# Patient Record
Sex: Female | Born: 1935 | Race: Black or African American | Hispanic: No
Health system: Southern US, Community
[De-identification: ages and names within clinical notes are randomized; demographics above are authoritative.]

---

## 2003-08-09 ENCOUNTER — Other Ambulatory Visit: Payer: Self-pay

## 2003-10-08 ENCOUNTER — Other Ambulatory Visit: Payer: Self-pay

## 2004-08-31 ENCOUNTER — Emergency Department: Payer: Self-pay | Admitting: Emergency Medicine

## 2005-01-03 ENCOUNTER — Other Ambulatory Visit: Payer: Self-pay

## 2005-01-03 ENCOUNTER — Emergency Department: Payer: Self-pay | Admitting: Emergency Medicine

## 2005-05-02 ENCOUNTER — Emergency Department: Payer: Self-pay | Admitting: Internal Medicine

## 2005-11-05 ENCOUNTER — Emergency Department: Payer: Self-pay | Admitting: Emergency Medicine

## 2006-01-09 ENCOUNTER — Emergency Department: Payer: Self-pay | Admitting: Emergency Medicine

## 2006-01-23 ENCOUNTER — Other Ambulatory Visit: Payer: Self-pay

## 2006-01-23 ENCOUNTER — Emergency Department: Payer: Self-pay | Admitting: Emergency Medicine

## 2006-03-19 ENCOUNTER — Other Ambulatory Visit: Payer: Self-pay

## 2006-03-19 ENCOUNTER — Inpatient Hospital Stay: Payer: Self-pay | Admitting: Internal Medicine

## 2006-07-18 ENCOUNTER — Emergency Department: Payer: Self-pay | Admitting: Emergency Medicine

## 2006-07-18 ENCOUNTER — Other Ambulatory Visit: Payer: Self-pay

## 2006-12-10 ENCOUNTER — Emergency Department: Payer: Self-pay | Admitting: Emergency Medicine

## 2006-12-10 ENCOUNTER — Other Ambulatory Visit: Payer: Self-pay

## 2007-03-25 ENCOUNTER — Emergency Department: Payer: Self-pay | Admitting: Internal Medicine

## 2007-07-06 ENCOUNTER — Emergency Department: Payer: Self-pay | Admitting: Emergency Medicine

## 2007-07-06 ENCOUNTER — Other Ambulatory Visit: Payer: Self-pay

## 2008-01-22 ENCOUNTER — Other Ambulatory Visit: Payer: Self-pay

## 2008-01-22 ENCOUNTER — Inpatient Hospital Stay: Payer: Self-pay | Admitting: Vascular Surgery

## 2008-04-20 ENCOUNTER — Emergency Department: Payer: Self-pay | Admitting: Emergency Medicine

## 2008-09-07 ENCOUNTER — Emergency Department: Payer: Self-pay | Admitting: Unknown Physician Specialty

## 2008-12-29 ENCOUNTER — Emergency Department: Payer: Self-pay | Admitting: Emergency Medicine

## 2009-02-12 ENCOUNTER — Emergency Department: Payer: Self-pay | Admitting: Unknown Physician Specialty

## 2009-03-29 ENCOUNTER — Emergency Department: Payer: Self-pay | Admitting: Emergency Medicine

## 2009-07-31 ENCOUNTER — Observation Stay: Payer: Self-pay | Admitting: Specialist

## 2010-01-17 ENCOUNTER — Emergency Department: Payer: Self-pay | Admitting: Emergency Medicine

## 2010-02-25 ENCOUNTER — Emergency Department: Payer: Self-pay | Admitting: Unknown Physician Specialty

## 2010-04-23 ENCOUNTER — Emergency Department: Payer: Self-pay | Admitting: Emergency Medicine

## 2011-10-20 ENCOUNTER — Emergency Department: Payer: Self-pay | Admitting: Emergency Medicine

## 2011-10-20 LAB — CBC
HGB: 11.3 g/dL — ABNORMAL LOW (ref 12.0–16.0)
MCH: 29.7 pg (ref 26.0–34.0)
MCHC: 32.8 g/dL (ref 32.0–36.0)
Platelet: 260 10*3/uL (ref 150–440)
RDW: 15.1 % — ABNORMAL HIGH (ref 11.5–14.5)

## 2011-10-20 LAB — COMPREHENSIVE METABOLIC PANEL
Albumin: 4 g/dL (ref 3.4–5.0)
Alkaline Phosphatase: 83 U/L (ref 50–136)
Anion Gap: 7 (ref 7–16)
Bilirubin,Total: 0.3 mg/dL (ref 0.2–1.0)
Chloride: 104 mmol/L (ref 98–107)
Creatinine: 0.91 mg/dL (ref 0.60–1.30)
EGFR (African American): >60
Glucose: 93 mg/dL (ref 65–99)
Potassium: 3.9 mmol/L (ref 3.5–5.1)
SGOT(AST): 20 U/L (ref 15–37)
SGPT (ALT): 17 U/L
Sodium: 140 mmol/L (ref 136–145)
Total Protein: 7.9 g/dL (ref 6.4–8.2)

## 2011-10-20 LAB — TROPONIN I: Troponin-I: <0.02 ng/mL

## 2011-10-24 ENCOUNTER — Observation Stay: Payer: Self-pay | Admitting: Internal Medicine

## 2011-10-24 LAB — COMPREHENSIVE METABOLIC PANEL
Albumin: 4.1 g/dL (ref 3.4–5.0)
Anion Gap: 7 (ref 7–16)
BUN: 16 mg/dL (ref 7–18)
Calcium, Total: 9.1 mg/dL (ref 8.5–10.1)
Chloride: 102 mmol/L (ref 98–107)
Co2: 29 mmol/L (ref 21–32)
Glucose: 98 mg/dL (ref 65–99)
Osmolality: 277 (ref 275–301)
SGOT(AST): 16 U/L (ref 15–37)
SGPT (ALT): 16 U/L
Sodium: 138 mmol/L (ref 136–145)
Total Protein: 8 g/dL (ref 6.4–8.2)

## 2011-10-24 LAB — URINALYSIS, COMPLETE
Bacteria: NONE SEEN
Ketone: NEGATIVE
WBC UR: 11 /HPF (ref 0–5)

## 2011-10-24 LAB — CK TOTAL AND CKMB (NOT AT ARMC)
CK, Total: 91 U/L (ref 21–215)
CK, Total: 82 U/L (ref 21–215)
CK, Total: 95 U/L (ref 21–215)
CK-MB: 1.2 ng/mL (ref 0.5–3.6)

## 2011-10-24 LAB — PROTIME-INR: INR: 0.9

## 2011-10-24 LAB — CBC WITH DIFFERENTIAL/PLATELET
Eosinophil %: 1.9 %
HCT: 37.3 % (ref 35.0–47.0)
HGB: 12.2 g/dL (ref 12.0–16.0)
Lymphocyte %: 27.2 %
MCHC: 32.7 g/dL (ref 32.0–36.0)
Monocyte %: 8.6 %
Platelet: 256 10*3/uL (ref 150–440)

## 2011-10-24 LAB — TROPONIN I
Troponin-I: <0.02 ng/mL
Troponin-I: <0.02 ng/mL
Troponin-I: <0.02 ng/mL

## 2011-10-25 LAB — CBC WITH DIFFERENTIAL/PLATELET
Basophil #: 0 10*3/uL (ref 0.0–0.1)
Eosinophil #: 0.2 10*3/uL (ref 0.0–0.7)
Eosinophil %: 2.4 %
HCT: 36.3 % (ref 35.0–47.0)
HGB: 11.7 g/dL — ABNORMAL LOW (ref 12.0–16.0)
Lymphocyte %: 31.6 %
MCH: 29.3 pg (ref 26.0–34.0)
MCHC: 32.3 g/dL (ref 32.0–36.0)
MCV: 91 fL (ref 80–100)
Monocyte #: 0.6 x10 3/mm (ref 0.2–0.9)
Neutrophil #: 3.4 10*3/uL (ref 1.4–6.5)
Platelet: 263 10*3/uL (ref 150–440)
RBC: 4 10*6/uL (ref 3.80–5.20)
RDW: 15.2 % — ABNORMAL HIGH (ref 11.5–14.5)
WBC: 6.1 10*3/uL (ref 3.6–11.0)

## 2011-10-25 LAB — BASIC METABOLIC PANEL
Anion Gap: 6 — ABNORMAL LOW (ref 7–16)
BUN: 18 mg/dL (ref 7–18)
Chloride: 103 mmol/L (ref 98–107)
Co2: 31 mmol/L (ref 21–32)
Creatinine: 1.03 mg/dL (ref 0.60–1.30)
EGFR (Non-African Amer.): 52 — ABNORMAL LOW
Glucose: 84 mg/dL (ref 65–99)
Potassium: 4 mmol/L (ref 3.5–5.1)

## 2011-10-25 LAB — LIPID PANEL
Cholesterol: 145 mg/dL (ref 0–200)
Triglycerides: 66 mg/dL (ref 0–200)
VLDL Cholesterol, Calc: 13 mg/dL (ref 5–40)

## 2012-08-17 ENCOUNTER — Emergency Department: Payer: Self-pay | Admitting: Emergency Medicine

## 2012-08-17 LAB — URINALYSIS, COMPLETE
Glucose,UR: NEGATIVE mg/dL (ref 0–75)
Ketone: NEGATIVE
Nitrite: NEGATIVE
Ph: 6 (ref 4.5–8.0)
RBC,UR: 17 /HPF (ref 0–5)
Specific Gravity: 1.02 (ref 1.003–1.030)
Squamous Epithelial: 1

## 2012-08-17 LAB — COMPREHENSIVE METABOLIC PANEL
BUN: 21 mg/dL — ABNORMAL HIGH (ref 7–18)
Bilirubin,Total: 0.3 mg/dL (ref 0.2–1.0)
Calcium, Total: 9.2 mg/dL (ref 8.5–10.1)
Co2: 27 mmol/L (ref 21–32)
Creatinine: 1.05 mg/dL (ref 0.60–1.30)
Osmolality: 280 (ref 275–301)
Potassium: 4.1 mmol/L (ref 3.5–5.1)
Sodium: 138 mmol/L (ref 136–145)
Total Protein: 8.1 g/dL (ref 6.4–8.2)

## 2012-08-17 LAB — CBC
HCT: 37.9 % (ref 35.0–47.0)
HGB: 12 g/dL (ref 12.0–16.0)
MCH: 28.7 pg (ref 26.0–34.0)
Platelet: 275 10*3/uL (ref 150–440)
RBC: 4.18 10*6/uL (ref 3.80–5.20)
RDW: 14.6 % — ABNORMAL HIGH (ref 11.5–14.5)

## 2012-08-18 LAB — LIPASE, BLOOD: Lipase: 169 U/L (ref 73–393)

## 2012-08-19 ENCOUNTER — Ambulatory Visit: Payer: Self-pay | Admitting: Unknown Physician Specialty

## 2012-08-19 LAB — COMPREHENSIVE METABOLIC PANEL
Alkaline Phosphatase: 89 U/L (ref 50–136)
Anion Gap: 7 (ref 7–16)
Bilirubin,Total: 0.5 mg/dL (ref 0.2–1.0)
Calcium, Total: 9.3 mg/dL (ref 8.5–10.1)
Chloride: 107 mmol/L (ref 98–107)
Creatinine: 0.97 mg/dL (ref 0.60–1.30)
EGFR (Non-African Amer.): 56 — ABNORMAL LOW
Glucose: 106 mg/dL — ABNORMAL HIGH (ref 65–99)
Osmolality: 287 (ref 275–301)
Potassium: 3.6 mmol/L (ref 3.5–5.1)
SGOT(AST): 23 U/L (ref 15–37)
SGPT (ALT): 18 U/L (ref 12–78)
Sodium: 141 mmol/L (ref 136–145)
Total Protein: 8.3 g/dL — ABNORMAL HIGH (ref 6.4–8.2)

## 2012-08-19 LAB — CBC
HCT: 36.4 % (ref 35.0–47.0)
HGB: 11.4 g/dL — ABNORMAL LOW (ref 12.0–16.0)
MCH: 28.4 pg (ref 26.0–34.0)
MCHC: 31.3 g/dL — ABNORMAL LOW (ref 32.0–36.0)
MCV: 91 fL (ref 80–100)
Platelet: 300 10*3/uL (ref 150–440)
RDW: 14.9 % — ABNORMAL HIGH (ref 11.5–14.5)

## 2012-08-19 LAB — URINALYSIS, COMPLETE
Bilirubin,UR: NEGATIVE
Leukocyte Esterase: NEGATIVE
Ph: 5 (ref 4.5–8.0)
RBC,UR: 10 /HPF (ref 0–5)
Squamous Epithelial: 6

## 2012-09-21 ENCOUNTER — Ambulatory Visit: Payer: Self-pay | Admitting: Unknown Physician Specialty

## 2012-10-27 ENCOUNTER — Emergency Department: Payer: Self-pay | Admitting: Emergency Medicine

## 2012-10-27 LAB — BASIC METABOLIC PANEL
BUN: 16 mg/dL (ref 7–18)
Calcium, Total: 9.2 mg/dL (ref 8.5–10.1)
EGFR (African American): >60
EGFR (Non-African Amer.): 52 — ABNORMAL LOW
Glucose: 74 mg/dL (ref 65–99)
Osmolality: 279 (ref 275–301)
Potassium: 3.8 mmol/L (ref 3.5–5.1)
Sodium: 140 mmol/L (ref 136–145)

## 2012-10-27 LAB — CBC WITH DIFFERENTIAL/PLATELET
Basophil #: 0 10*3/uL (ref 0.0–0.1)
Eosinophil #: 0.2 10*3/uL (ref 0.0–0.7)
HCT: 35.1 % (ref 35.0–47.0)
HGB: 11.5 g/dL — ABNORMAL LOW (ref 12.0–16.0)
Lymphocyte %: 23.6 %
MCH: 29.1 pg (ref 26.0–34.0)
MCHC: 32.7 g/dL (ref 32.0–36.0)
MCV: 89 fL (ref 80–100)
Monocyte #: 0.5 x10 3/mm (ref 0.2–0.9)
Neutrophil #: 3.6 10*3/uL (ref 1.4–6.5)
Platelet: 256 10*3/uL (ref 150–440)
RBC: 3.95 10*6/uL (ref 3.80–5.20)

## 2012-10-27 LAB — URINALYSIS, COMPLETE
Bacteria: NONE SEEN
Bilirubin,UR: NEGATIVE
Ketone: NEGATIVE
Leukocyte Esterase: NEGATIVE
Nitrite: NEGATIVE
Ph: 7 (ref 4.5–8.0)
Protein: NEGATIVE
RBC,UR: 1 /HPF (ref 0–5)
Squamous Epithelial: <1

## 2012-10-27 LAB — PROTIME-INR
INR: 1
Prothrombin Time: 13.6 secs (ref 11.5–14.7)

## 2012-10-27 LAB — HEPATIC FUNCTION PANEL A (ARMC)
Bilirubin, Direct: <0.1 mg/dL (ref 0.00–0.20)
Bilirubin,Total: 0.3 mg/dL (ref 0.2–1.0)
SGOT(AST): 22 U/L (ref 15–37)
SGPT (ALT): 17 U/L (ref 12–78)
Total Protein: 7.6 g/dL (ref 6.4–8.2)

## 2012-10-27 LAB — TROPONIN I: Troponin-I: <0.02 ng/mL

## 2012-10-27 LAB — APTT: Activated PTT: 29.2 secs (ref 23.6–35.9)

## 2013-02-14 ENCOUNTER — Emergency Department: Payer: Self-pay | Admitting: Emergency Medicine

## 2013-02-14 LAB — BASIC METABOLIC PANEL
Anion Gap: 5 — ABNORMAL LOW (ref 7–16)
Calcium, Total: 9.1 mg/dL (ref 8.5–10.1)
EGFR (African American): 56 — ABNORMAL LOW
EGFR (Non-African Amer.): 48 — ABNORMAL LOW
Glucose: 114 mg/dL — ABNORMAL HIGH (ref 65–99)
Osmolality: 281 (ref 275–301)
Sodium: 139 mmol/L (ref 136–145)

## 2013-02-14 LAB — URINALYSIS, COMPLETE
Glucose,UR: NEGATIVE mg/dL (ref 0–75)
Nitrite: NEGATIVE
Ph: 6 (ref 4.5–8.0)
Protein: NEGATIVE
RBC,UR: 9 /HPF (ref 0–5)
Squamous Epithelial: 3

## 2013-02-14 LAB — CBC
HGB: 12 g/dL (ref 12.0–16.0)
MCHC: 33.8 g/dL (ref 32.0–36.0)
MCV: 89 fL (ref 80–100)
Platelet: 243 10*3/uL (ref 150–440)
RBC: 3.99 10*6/uL (ref 3.80–5.20)
WBC: 6 10*3/uL (ref 3.6–11.0)

## 2013-02-14 LAB — MAGNESIUM: Magnesium: 2.2 mg/dL

## 2013-05-22 IMAGING — CT CT ABD-PELV W/ CM
1 of 3 series · 14 of 32 positions shown, 19 images · non-contrast
Comparison: none

REASON FOR EXAM: (1) GEN ABD PAIN; (2) N/V
COMMENTS:   May transport without cardiac monitor

PROCEDURE:     CT  - CT ABDOMEN / PELVIS  W  - August 18, 2012 [DATE]
RESULT:     History: Pain.
Comparison Study: No recent.

[Series 2: 3mm soft tissue · axial · 0.79mm/px · z∈[-397,-16]mm · 14 of 143 slices shown, 19 images]
[im 8/143  soft-tissue]
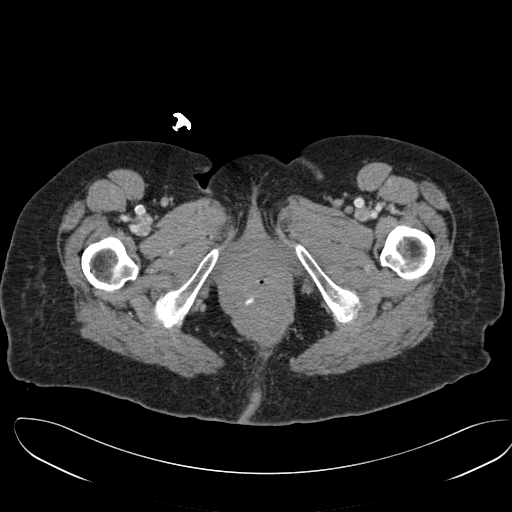
[im 8/143  bone]
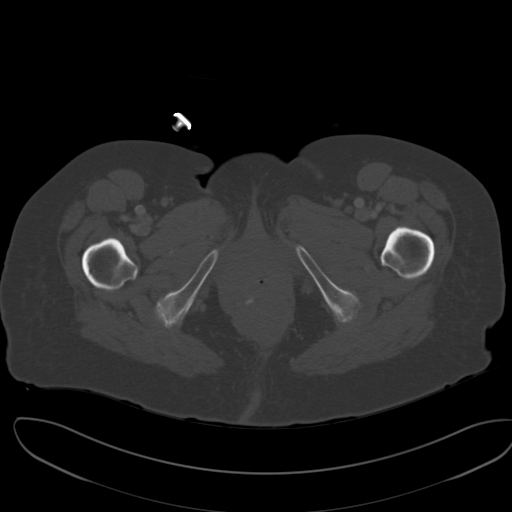
[im 23/143  soft-tissue]
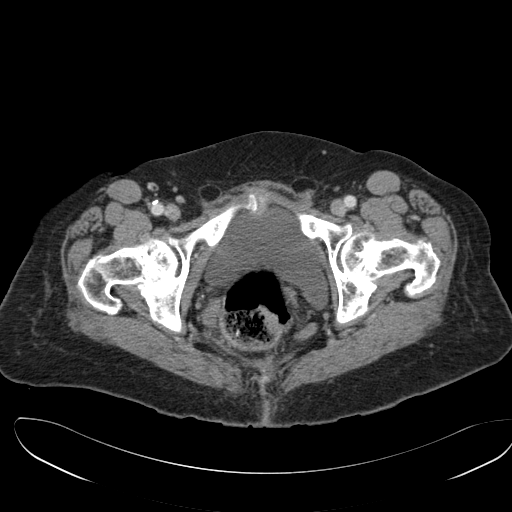
[im 30/143  soft-tissue]
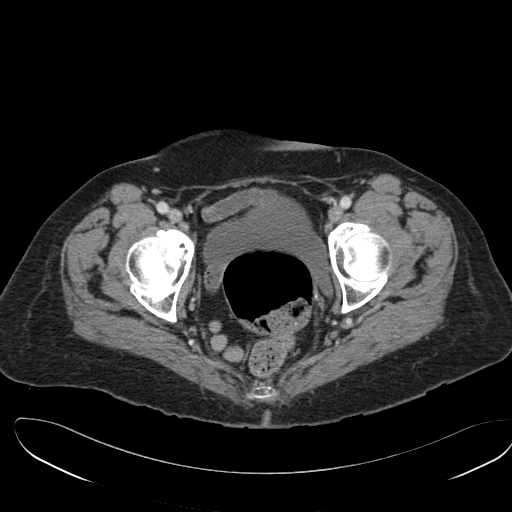
[im 38/143  soft-tissue]
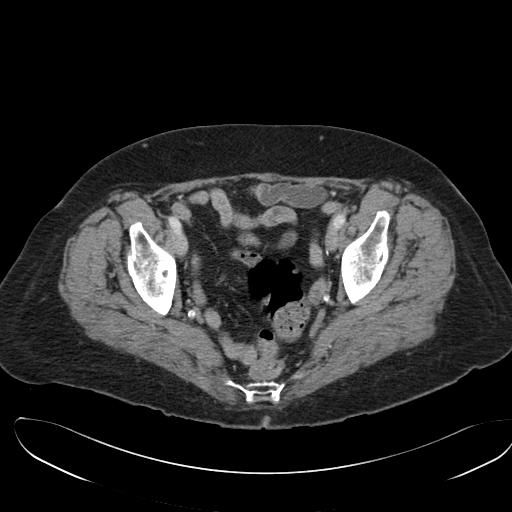
[im 53/143  soft-tissue]
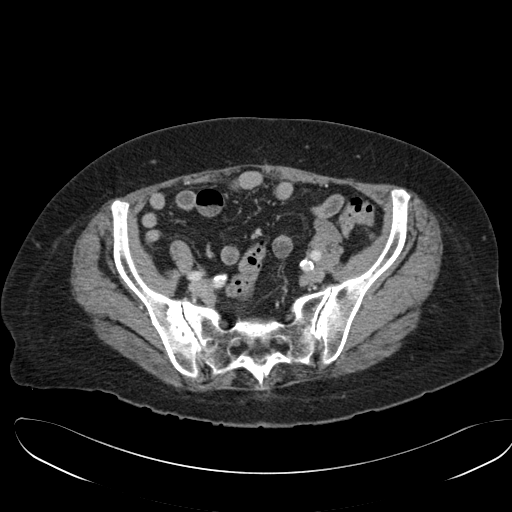
[im 60/143  soft-tissue]
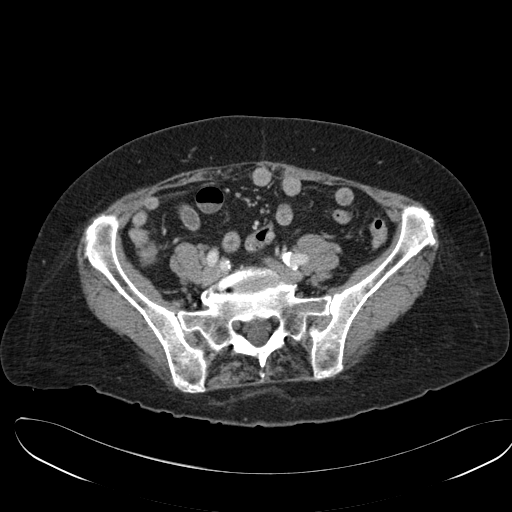
[im 75/143  soft-tissue]
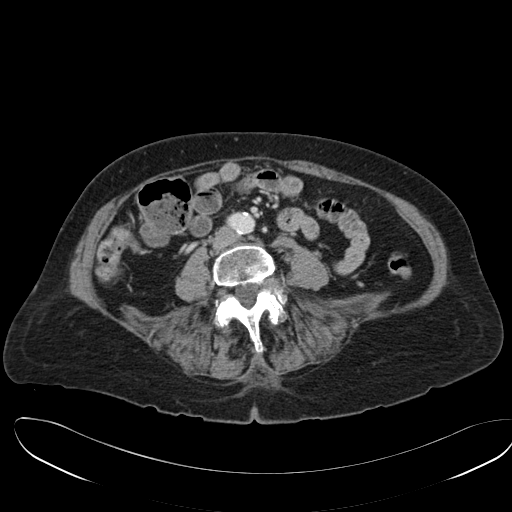
[im 83/143  soft-tissue]
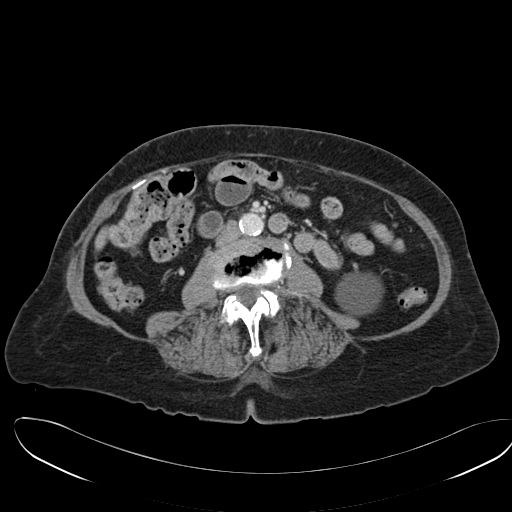
[im 90/143  soft-tissue]
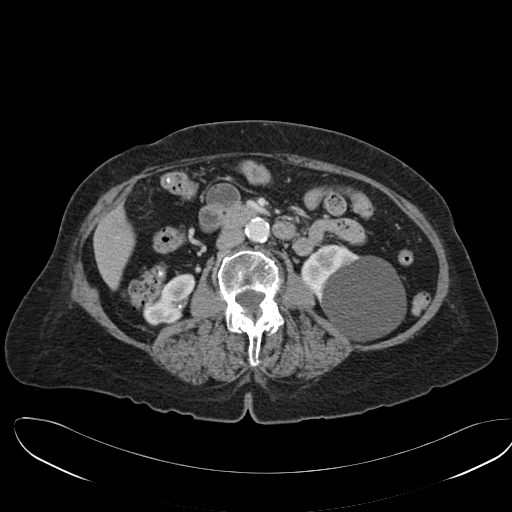
[im 90/143  bone]
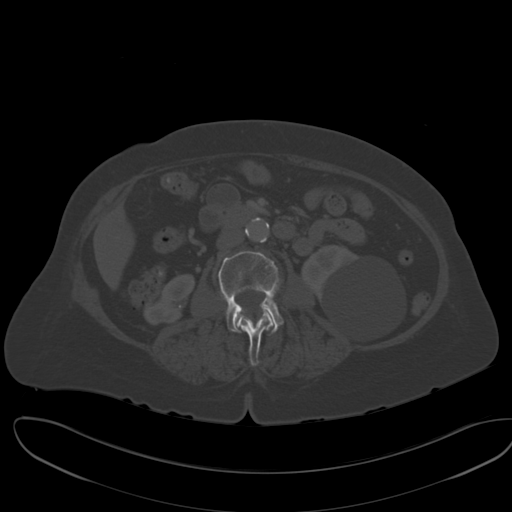
[im 105/143  soft-tissue]
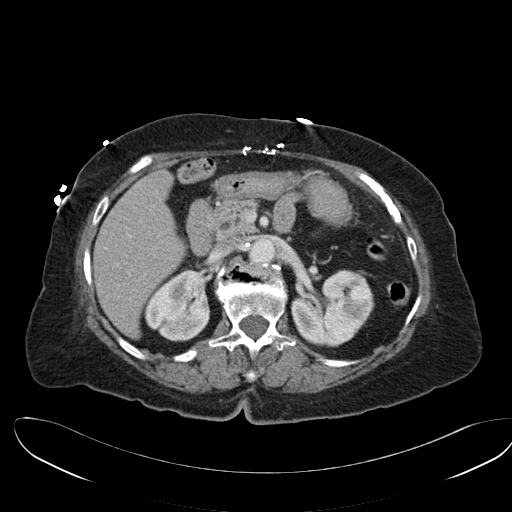
[im 113/143  soft-tissue]
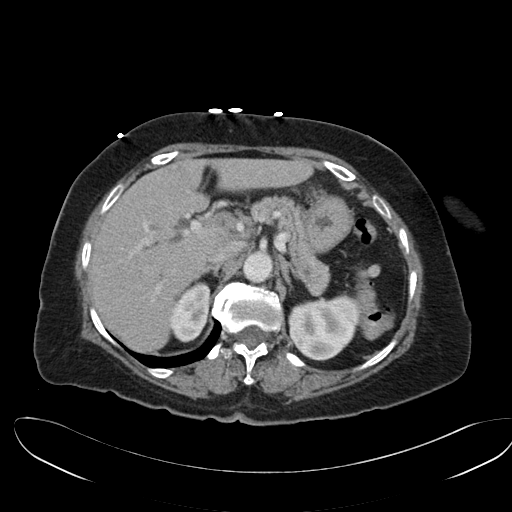
[im 113/143  lung]
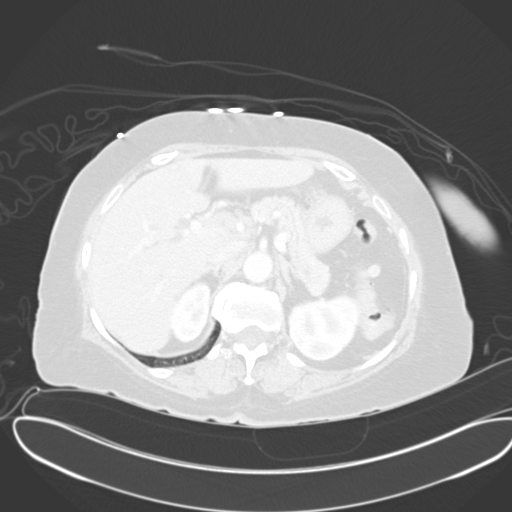
[im 120/143  soft-tissue]
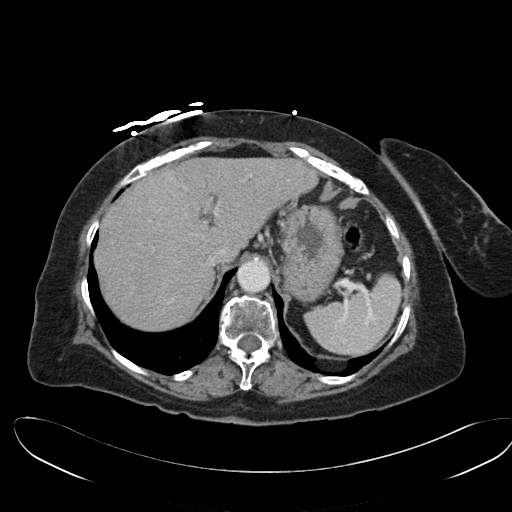
[im 120/143  lung]
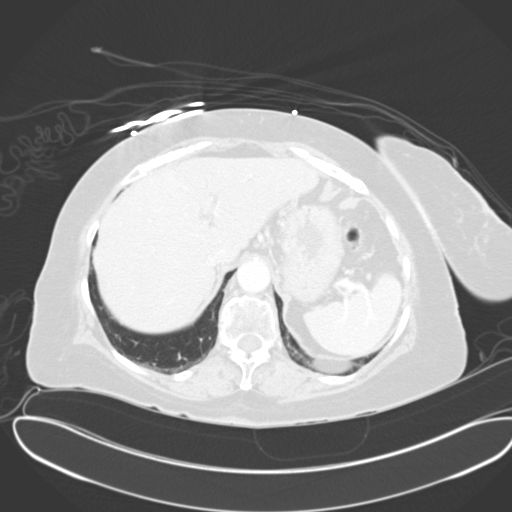
[im 128/143  lung]
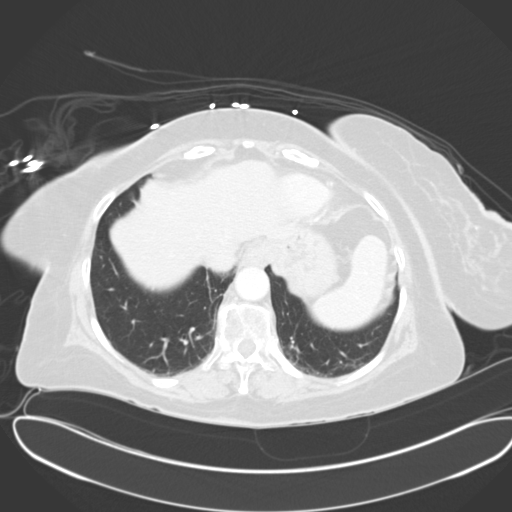
[im 135/143  soft-tissue]
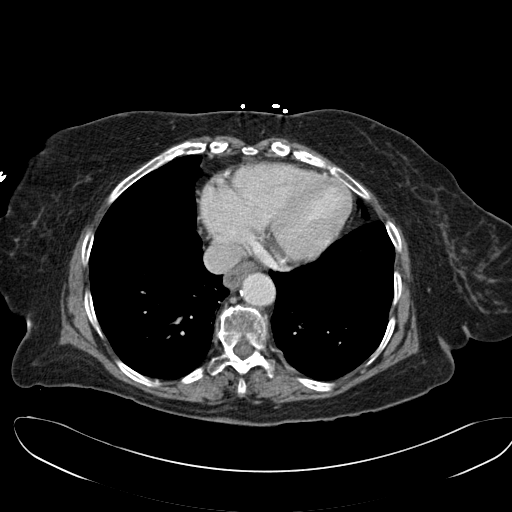
[im 135/143  lung]
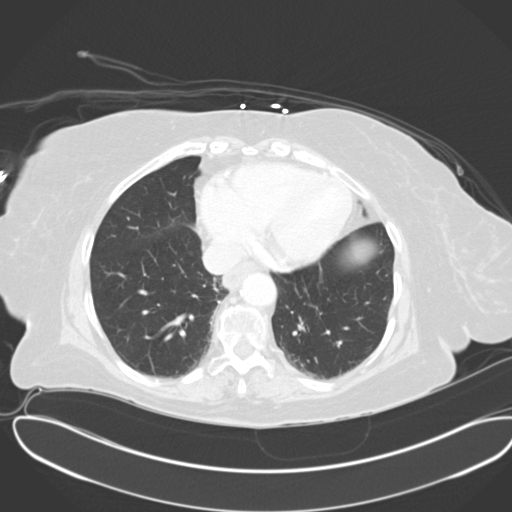

[14 of 32 positions shown; findings below may reference images not displayed]

FINDINGS: Standard CT obtained 85 cc of [id number]-S22. Liver normal. Mild
distention of the common bile duct to 7.5 mm with mild intrahepatic ductal
distention. M.R.C.P. can be obtained for further evaluation. Pancreas there
is trace no focal abnormalities is minimal prominence of the pancreatic
duct. Spleen normal. Bilateral renal cysts. Right nephrolithiasis. No
hydronephrosis. Aorta normal caliber. Appendix normal. No bowel distention.
Bladder is nondistended. Sclerosis of SI joints noted. Sliding hiatal hernia
noted. Debris is noted in the distal esophagus. To exclude an obstructing
lesion barium swallow and upper GI suggested.
IMPRESSION: 1. Debris noted in the distal esophagus/hiatal hernia. Barium swallow and
upper GI suggested to exclude obstructing lesion.
2. Mild biliary distention. M.R.C.P. suggested for further evaluation.
3. Bilateral renal cysts end tiny sand like right renal calyceal stone.

## 2013-07-16 ENCOUNTER — Emergency Department: Payer: Self-pay | Admitting: Emergency Medicine

## 2013-07-16 LAB — CBC WITH DIFFERENTIAL/PLATELET
BASOS ABS: 0.1 10*3/uL (ref 0.0–0.1)
Basophil %: 0.8 %
Eosinophil #: 0.1 10*3/uL (ref 0.0–0.7)
Eosinophil %: 1.8 %
HCT: 38 % (ref 35.0–47.0)
HGB: 12.2 g/dL (ref 12.0–16.0)
Lymphocyte #: 2 10*3/uL (ref 1.0–3.6)
Lymphocyte %: 24.5 %
MCH: 28.6 pg (ref 26.0–34.0)
MCHC: 32.1 g/dL (ref 32.0–36.0)
MCV: 89 fL (ref 80–100)
Monocyte #: 0.7 x10 3/mm (ref 0.2–0.9)
Monocyte %: 9.1 %
Neutrophil #: 5.1 10*3/uL (ref 1.4–6.5)
Neutrophil %: 63.8 %
Platelet: 245 10*3/uL (ref 150–440)
RBC: 4.26 10*6/uL (ref 3.80–5.20)
RDW: 15.9 % — ABNORMAL HIGH (ref 11.5–14.5)
WBC: 8 10*3/uL (ref 3.6–11.0)

## 2013-07-16 LAB — URINALYSIS, COMPLETE
BACTERIA: NONE SEEN
Bilirubin,UR: NEGATIVE
Glucose,UR: NEGATIVE mg/dL (ref 0–75)
Ketone: NEGATIVE
Nitrite: NEGATIVE
PH: 6 (ref 4.5–8.0)
Protein: NEGATIVE
RBC,UR: 4 /HPF (ref 0–5)
SPECIFIC GRAVITY: 1.016 (ref 1.003–1.030)
WBC UR: 6 /HPF (ref 0–5)

## 2013-07-16 LAB — COMPREHENSIVE METABOLIC PANEL
ALK PHOS: 86 U/L
ALT: 14 U/L (ref 12–78)
Albumin: 3.6 g/dL (ref 3.4–5.0)
Anion Gap: 3 — ABNORMAL LOW (ref 7–16)
BILIRUBIN TOTAL: 0.2 mg/dL (ref 0.2–1.0)
BUN: 13 mg/dL (ref 7–18)
CALCIUM: 8.8 mg/dL (ref 8.5–10.1)
Chloride: 108 mmol/L — ABNORMAL HIGH (ref 98–107)
Co2: 27 mmol/L (ref 21–32)
Creatinine: 0.98 mg/dL (ref 0.60–1.30)
EGFR (Non-African Amer.): 54 — ABNORMAL LOW
Glucose: 74 mg/dL (ref 65–99)
OSMOLALITY: 274 (ref 275–301)
Potassium: 3.2 mmol/L — ABNORMAL LOW (ref 3.5–5.1)
SGOT(AST): 20 U/L (ref 15–37)
Sodium: 138 mmol/L (ref 136–145)
Total Protein: 7.3 g/dL (ref 6.4–8.2)

## 2013-07-16 LAB — LIPASE, BLOOD: Lipase: 168 U/L (ref 73–393)

## 2013-07-16 LAB — TROPONIN I: Troponin-I: <0.02 ng/mL

## 2013-07-31 IMAGING — CR DG CHEST 1V PORT
1 series · 1 of 1 positions shown · non-contrast
Comparison: none

REASON FOR EXAM: cp
COMMENTS:

PROCEDURE:     DXR - DXR PORTABLE CHEST SINGLE VIEW  - October 27, 2012  [DATE]
RESULT:     Comparison: 10/24/2011

[ap]
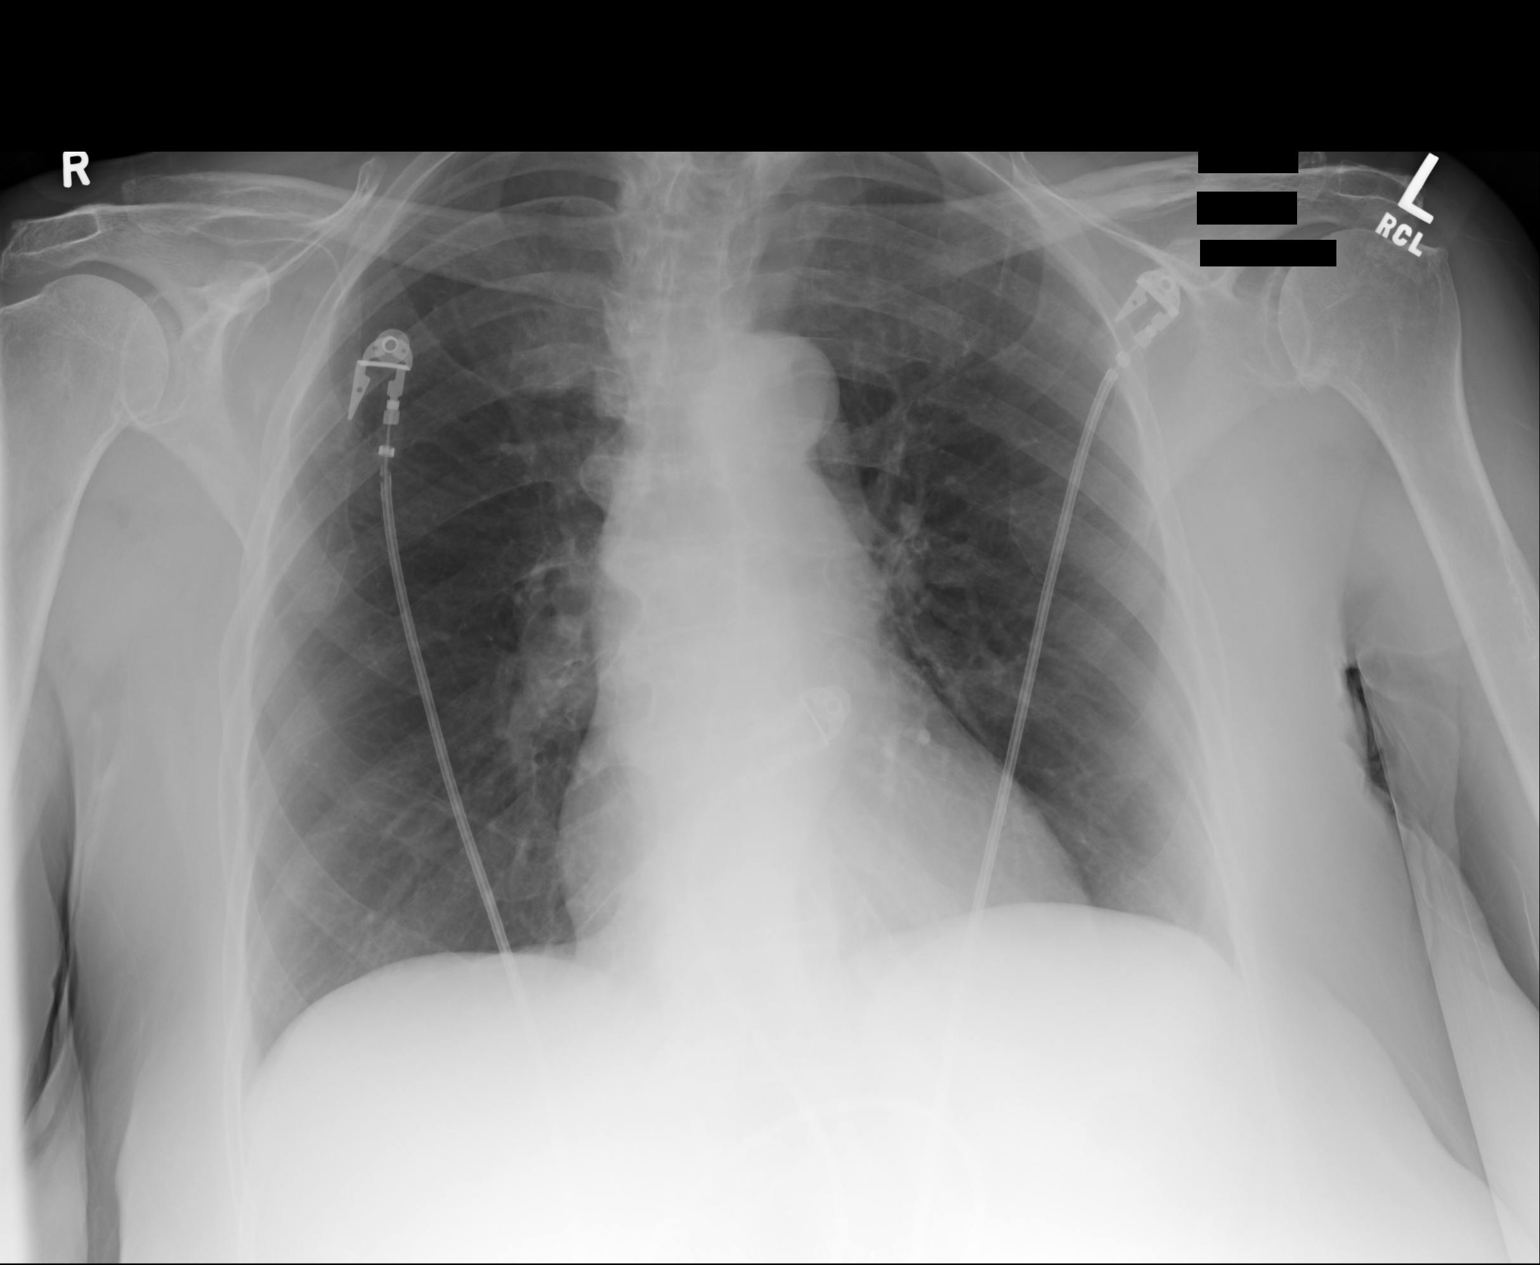

[1 of 1 positions shown; findings below may reference images not displayed]

FINDINGS: The heart and mediastinum are stable. Slight increased density overlying the
medial right upper lung is likely secondary to patient rotation. The lungs
are otherwise clear.
IMPRESSION: 1. No acute cardiopulmonary disease.
2. Slight increased density overlying the medial right upper lung is likely
related to patient rotation. However, PA and lateral chest radiograph is
recommended to exclude a true pulmonary abnormality.

[REDACTED]

## 2014-06-28 ENCOUNTER — Ambulatory Visit: Payer: Self-pay | Admitting: Internal Medicine

## 2014-06-28 LAB — CK TOTAL AND CKMB (NOT AT ARMC)
CK, TOTAL: 143 U/L (ref 26–192)
CK-MB: 0.8 ng/mL (ref 0.5–3.6)

## 2014-06-28 LAB — TROPONIN I

## 2014-06-29 LAB — D-DIMER(ARMC): D-Dimer: 1010 ng/ml

## 2014-10-16 NOTE — Discharge Summary (Signed)
PATIENT NAME:  Armandina GemmaLONG, Eulanda C MR#:  [id number] DATE OF BIRTH:  1930/11/15  DATE OF ADMISSION:  10/24/2011 DATE OF DISCHARGE:  10/25/2011  ADMISSION DIAGNOSIS: Chest pain.  DISCHARGE DIAGNOSES:  1. Chest pain felt to be musculoskeletal, noncardiac in nature status post Lexiscan sestamibi which shows  no evidence of ischemia.  2. Previous history of coronary artery disease with 60% right coronary artery stenosis based on cardiac catheterization in February 2011.  3. Hypertension.  4. Diet-controlled diabetes.  5. Gastroesophageal reflux disease.  6. Hyperlipidemia.  7. Rheumatoid arthritis.  8. Previous distal esophageal stricture, status post esophageal stretching.  9. History of anemia.  10. Status post cholecystectomy.  11. Status post hysterectomy.  12. Status post exploratory laparotomy with esophageal stretching.   PERTINENT LABORATORY, DIAGNOSTIC AND RADIOLOGICAL DATA:  The patient's CT per PE protocol was negative for PE. EKG: Normal sinus rhythm without any ST-T wave changes. Urinalysis leukocyte esterase 2+ but WBC was only 11.  Chest x-ray showed no acute cardiopulmonary processes. CPK was 91, CK-MB 1.2. WBC 6.5, hemoglobin 12.2, platelet count 256. BMP: Glucose 98, BUN 16, creatinine 0.98, sodium 138, potassium 3.5, chloride 102, CO2 29.  Liver function tests are normal.  INR is 0.9, troponin less than 0.02, D-Dimer 0.78.  Fasting lipid panel: Total cholesterol 145, triglycerides 66, HDL 56, LDL 76.  CT per PE protocol showed no PE.   CONSULTANTS: Dr. Juliann Paresallwood. PROCEDURE: Lexiscan sestamibi. The results of the Lexiscan-sestamibi showed a fixed inferior wall defect on rest images and not appreciated on stress images suggestive of reversal distribution. No definite evidence of scar or ischemia.   HOSPITAL COURSE: Please see H and P done by me on presentation. The patient is a 81 year old African American female with a history of hypertension, diabetes, gastroesophageal  reflux disease, hyperlipidemia, history of rheumatoid arthritis, previous history of chest pain and had a cardiac catheterization in 2011 which showed RCA with 60% stenosis and other nonobstructive disease. She presented with complaint of having chest pain for the past few days. The patient was placed in the Observation Unit.   Serial cardiac enzymes were done. A Cardiology consult was obtained. Cardiology recommended a stress test. The patient underwent a stress test which showed no evidence of ischemia. She was cleared by Cardiology to be discharged home. She did have reproducible nature to her symptoms, possibly reflecting musculoskeletal chest pain. At this time she is stable for discharge.   DISCHARGE MEDICATIONS:  1. Tramadol 50 q.i.d. as needed. 2. Hydroxychloroquine 200 mg b.i.d.  3. Aspirin 81mg , 1 tab daily.  4. Alprazolam 0.25 mg t.i.d.  5. Simvastatin 40 mg daily.  6. Reglan 10 mg at bedtime. 7. Nexium 40 mg daily. 8. Lisinopril 5 mg daily.  9. Lasix 20 mg daily. 10. Metoprolol 25 mg daily. 11. Motrin 200 mg, 1 tab p.o. every 6 hours p.r.n. for pain.   DIET: Low sodium.  ACTIVITY: As tolerated.   FOLLOWUP:  1. Follow up with Dr. Kandy GarrisonStephen Kizer 1 to 2 weeks.  2. Follow up with Dr. Juliann Paresallwood in 3 to 4 weeks.      TIME SPENT:   25 minutes.   ____________________________ Lacie ScottsShreyang H. Allena KatzPatel, MD shp:cbb D: 10/28/2011 11:30:00 ET T: 10/28/2011 12:00:00 ET JOB#: [id number]  cc: Tor Tsuda H. Allena KatzPatel, MD, <Dictator> Kandy GarrisonStephen Kizer, MD  Charise CarwinSHREYANG H Zianne Schubring MD ELECTRONICALLY SIGNED 11/02/2011 9:01

## 2014-10-16 NOTE — H&P (Signed)
PATIENT NAME:  Chelsea Rubio, Verlee C MR#:  [id number] DATE OF BIRTH:  1930/09/17  DATE OF ADMISSION:  10/24/2011  [facility] C[facility]IC[facility]   ER REFERRING PHYSICIAN: Dr. Glennie IsleSheryl Gottlieb  CHIEF COMPLAINT: Chest pain.   HISTORY OF PRESENT ILLNESS: The patient is a 81 year old African American female with history of hypertension, diabetes, gastroesophageal reflux disease, hyperlipidemia, history of rheumatoid arthritis, who has a history of moderate RCA disease based on cardiac catheterization in 2011, presents with complaint of chest pain which she describes as a left-sided chest pain which woke her up from sleep. She describes it as a nagging type of sensation. The patient has had similar symptoms for the last few days. She was actually seen in the ED with the same presentation, discharged after cardiac enzymes and EKG were negative. The patient reports that since then she has been having this left-sided discomfort. It is unrelated to activity, lasting for a few minutes. She did not have any associated shortness of breath, nausea, tingling or numbness. She denies any abdominal pain. The patient did have an elevated D-dimer so underwent a CT per PE protocol which was negative.   PAST MEDICAL HISTORY: Significant for: 1. Hypertension.  2. Diet-controlled diabetes.  3. Gastroesophageal reflux disease.  4. Hyperlipidemia.  5. Rheumatoid arthritis.  6. Coronary artery disease with cardiac catheterization done in February 2011 which showed ejection fraction of 65% with minor irregularities in the LAD and left circumflex, moderate disease in the mid RCA.  7. History of esophageal stricture. status post stretching.  8. History of anemia.   PAST SURGICAL HISTORY:  1. Status post cholecystectomy. 2. Hysterectomy. 3. Exploratory laparoscopy with esophageal stretching.   ALLERGIES: None.   CURRENT MEDICATIONS:  1. Alprazolam 0.25 t.i.d.  2. Aspirin 81 mg 1 tab p.o. daily.   3. Lasix 20 mg daily.  4. Hydrochloroquine 200 b.i.d.  5. Lisinopril 5 mg daily.  6. Reglan 10 mg at bedtime.  7. Metoprolol 25 mg p.o. daily.  8. Nexium 40 mg daily.  9. Simvastatin 40 mg daily.  10. Tramadol 50, 1 tab 4 times per day.   SOCIAL HISTORY: Does not smoke. Does not drink. No drugs.   FAMILY HISTORY: Father died of some complications related to his abdomen with rupture in the abdomen. Mother died of natural causes. Brother had some sort of cancer.    REVIEW OF SYSTEMS: CONSTITUTIONAL: Denies any fevers, fatigue. Complains of some weakness, chest pain as above. No weight loss. No weight gain. EYES: No blurred or double vision. No pain. No redness. No inflammation. Does have history of cataracts. ENT: No tinnitus. No ear pain. No hearing loss. No seasonal or year-round allergies. No epistaxis. No nasal discharge. No difficulty with swallowing. RESPIRATORY: No cough. No wheezing. No hemoptysis. No chronic obstructive pulmonary disease. No tuberculosis. CARDIOVASCULAR: Chest pain as above. No orthopnea. Has chronic edema. No arrhythmias. No syncope. Does have high blood pressure. GASTROINTESTINAL: No nausea, vomiting, diarrhea. No abdominal pain. No hematemesis. No melena. No ulcer. No gastroesophageal reflux disease. No irritable bowel syndrome. GENITOURINARY: Denies any dysuria, hematuria, renal calculus or frequency. ENDOCRINE: Denies any polyuria, nocturia, or thyroid problems. HEME/LYMPH: Has a history of anemia. Denies any easy bruisability or bleeding. SKIN: No acne. No rash. No changes in mole, hair or skin. MUSCULOSKELETAL: Has pain related to joint pains related to osteoarthritis. No gout, no redness. Does have rheumatoid arthritis. NEUROLOGIC: No numbness. No cerebrovascular accident. No transient ischemic attack. No seizures. PSYCHIATRIC:  No anxiety. No insomnia.    PHYSICAL EXAMINATION:  VITAL SIGNS: Temperature 98.2, pulse 56, respirations 20, blood pressure 158/78.    GENERAL: The patient is an elderly African American female in no acute distress.   HEENT: Head atraumatic, normocephalic. Pupils are equally round, reactive to light and accommodation. Extraocular movements are intact. Oropharynx is clear without any exudates.   NECK: No thyromegaly. No carotid bruits.   CARDIOVASCULAR: Regular rate and rhythm. No murmurs, rubs, clicks, or gallops. PMI is not displaced.   LUNGS: Clear to auscultation bilaterally without any rales, rhonchi, or wheezing. No accessory muscle usage.   MUSCULOSKELETAL: The patient does have some reproducible chest pain.   ABDOMEN: Soft, nontender, nondistended. Positive bowel sounds x4. There is no hepatosplenomegaly.   EXTREMITIES: No clubbing, cyanosis, or edema.   SKIN: No rash.   LYMPHATICS: No lymph nodes palpable.   VASCULAR: Good DP, PT pulses.   LYMPH NODES: No lymph nodes palpable.   PSYCHIATRIC:  Not anxious or depressed.   NEUROLOGICAL: Awake, alert, oriented x3. No focal deficits.   LABORATORY, DIAGNOSTIC AND RADIOLOGICAL DATA:  CT per PE protocol in the ED was negative for pulmonary embolus.  EKG: Normal sinus rhythm without any ST-T wave changes.  Urinalysis shows leukocyte esterase 2+, WBCs 11.0.  Chest x-ray shows no acute cardiopulmonary processes.  CPK is 91, CK-MB 1.2. WBC 6.5, hemoglobin 12.2, platelet count 256.  BMP: Glucose is 98, BUN 16, creatinine 0.98, sodium 138, potassium 3.5, chloride 102, CO2  is 29.  LFTs are normal.  INR 0.9. Troponin less than 0.02. D-dimer 0.78.   ASSESSMENT AND PLAN: The patient is a 81 year old African American female with history of moderate RCA disease based on catheterization in 2011, presents with chest pain. She was seen in the ED a few days ago and returns back with chest pain.   1. Chest pain with some reproducible symptoms: She does have history of coronary artery disease with RCA 60% in February 2011. Certainly there is concern that she could have  the progression of this disease. We will ask Cardiology to see her. We will also schedule her for a stress test in the morning. We will follow her cardiac enzymes. Sublingual nitroglycerin p.r.n.  We will continue aspirin.  2. Diabetes, not on any treatment: Blood glucose is currently stable. We will monitor her sugars.  3. Hypertension: Continue metoprolol and lisinopril. Her blood pressure is a little elevated. We will add p.r.n. hydralazine.  4. Hyperlipidemia: We will continue her simvastatin. Check a fasting lipid panel in the a.m.  5. Rheumatoid arthritis:  Continue hydrochloroquine as taking at home.  6. Gastroesophageal reflux disease: We will continue Nexium as taking at home.  7. For deep venous thrombosis prophylaxis, we will place her on Lovenox.Marland Kitchen  TIME SPENT:   35 minutes spent.    ____________________________ Lacie Scotts. Allena Katz, MD shp:cbb D: 10/24/2011 12:31:38 ET T: 10/24/2011 12:47:40 ET JOB#: [id number]  cc: Calab Sachse H. Allena Katz, MD, <Dictator> Juanda Chance, MD at [facility] Molinda Bailiff MD ELECTRONICALLY SIGNED 10/25/2011 15:26

## 2014-10-16 NOTE — Consult Note (Signed)
PATIENT NAME:  Chelsea Rubio, Chelsea Rubio MR#:  [id number] DATE OF BIRTH:  Nov 14, 1930  DATE OF CONSULTATION:  10/24/2011  REFERRING PHYSICIAN:  [facility] CONSULTING PHYSICIAN:  Akeya Ryther D. Shail Urbas, MD  INDICATION: Chest pain.  PRIMARY CARE PHYSICIAN:  Dr. Victorino December, [facility].   HISTORY OF PRESENT ILLNESS:  Chelsea Rubio is a 81 year old African American female with hypertension, diabetes, reflux, hyperlipidemia and rheumatoid arthritis who has known moderate disease of the RCA from a cardiac catheterization in 2011. The patient has described left-sided chest pain which woke her up from sleep. She describes nagging pain sensation. She has had some episodes in the past few days. She was actually seen in the Emergency Room for the same presentation and was discharged after cardiac enzymes were negative. The patient reports she has been having recurrent chest discomfort. It is unrelated to activity lasting several minutes. She did not have any associated shortness of breath with nausea or tingling. She denies any abdominal pain. The patient had a d-dimer elevated, so a CT of the chest was done which was unremarkable. She continued to have significant chest pain even at rest.   REVIEW OF SYSTEMS: No blackout spells or syncope. No nausea or vomiting. Denies fever or chills. No sweats. No weight loss. No weight gain. No hemoptysis or hematemesis. No bright red blood per rectum.   PAST MEDICAL HISTORY:  1. Hypertension.  2. Diabetes. 3. Reflux.  4. Hyperlipidemia.  5. Rheumatoid arthritis.  6. Coronary artery disease.  7. Esophageal stricture.  8. Anemia.   PAST SURGICAL HISTORY:  1. Cholecystectomy.  2. Hysterectomy.  3. Exploratory laparoscopy with esophageal stretch.  4. Cardiac catheterization.   ALLERGIES: None.   MEDICATIONS:  1. Alprazolam 0.25 three times a day.  2. Aspirin 81 mg a day.  3. Lasix 20 a day.  4. Hydroxychloroquine 200 mg twice a day.  5. Lisinopril 5 a day.  6. Reglan 10 mg at  bedtime.  7. Metoprolol 25 daily.  8. Nexium 40 mg a day.  9. Simvastatin 40. 10. Tramadol 50 mg 4 times a day.   SOCIAL HISTORY: No smoking, drinking or alcohol consumption. Retired.   FAMILY HISTORY: Abdominal rupture, natural causes, cancer.   PHYSICAL EXAMINATION:  VITAL SIGNS: Blood pressure 160/80, pulse 60, respiratory rate 18, afebrile.   HEENT: Normocephalic, atraumatic. Pupils equal and reactive to light.   NECK: Supple. No jugular venous distention, bruits, or adenopathy.   LUNGS: Clear to auscultation and percussion. No wheezing, rhonchi or rales.    ABDOMEN: Benign.   EXTREMITIES: Within normal limits.   NEUROLOGIC: Intact.   SKIN: Normal.   LABORATORY, RADIOLOGICAL AND DIAGNOSTIC DATA: CT of the chest negative. EKG: Normal sinus rhythm, nonspecific ST-T changes. Urinalysis was unremarkable. Chest x-ray unremarkable. CPK 91, MB 1.3, white count 6.5, hemoglobin 12.2, platelet count 256, glucose 98, BUN 16, creatinine 0.98, sodium 138, potassium 3.5, chloride 102, CO2 29. LFTs negative. INR negative. D-dimer was elevated at 0.78.   ASSESSMENT:  1. Chest pain. 2. Angina. 3. Hypertension. 4. Diabetes.  5. Hyperlipidemia.  6. Mild obesity. 7. Rheumatoid arthritis.  8. Reflux.   PLAN:  1. I agree, place on telemetry, anticoagulation if possible.  2. Unstable angina. Continue aspirin therapy.  3. Will consider whether further evaluation should include a cardiac catheterization or a functional study. I am more in favor of a functional study at this point, probably a [employer]. We will continue reflux medications.  4. Continue diabetes control.  5. Hypertension  control. Metoprolol, lisinopril, maybe add hydralazine as needed.  6. Lipid management with simvastatin. 7. Continue rheumatoid arthritis medication.  8. Hopefully, if the patient's functional study is negative, we can discharge the patient and follow up as an outpatient.    ____________________________ Bobbie Stackwayne D. Juliann Paresallwood, MD ddc:ap D: 10/25/2011 07:36:45 ET T: 10/25/2011 09:56:13 ET JOB#: [id number]  cc: Tilda Samudio D. Juliann Paresallwood, MD, <Dictator> Alwyn PeaWAYNE D Demmi Sindt MD ELECTRONICALLY SIGNED 10/30/2011 22:58

## 2017-05-24 DEATH — deceased
